# Patient Record
Sex: Female | Born: 1995 | Race: Black or African American | Hispanic: No | Marital: Single | Smoking: Former smoker
Health system: Southern US, Community
[De-identification: ages and names within clinical notes are randomized; demographics above are authoritative.]

---

## 2018-05-05 ENCOUNTER — Encounter (HOSPITAL_COMMUNITY): Payer: Self-pay | Admitting: *Deleted

## 2018-05-05 ENCOUNTER — Emergency Department (HOSPITAL_COMMUNITY)
Admission: EM | Admit: 2018-05-05 | Discharge: 2018-05-05 | Disposition: A | Discharge status: Home / Self Care | Payer: Self-pay | Attending: Emergency Medicine | Admitting: Emergency Medicine

## 2018-05-05 DIAGNOSIS — Z87891 Personal history of nicotine dependence: Secondary | ICD-10-CM | POA: Insufficient documentation

## 2018-05-05 DIAGNOSIS — H02846 Edema of left eye, unspecified eyelid: Secondary | ICD-10-CM

## 2018-05-05 DIAGNOSIS — H02844 Edema of left upper eyelid: Secondary | ICD-10-CM | POA: Insufficient documentation

## 2018-05-05 MED ORDER — FLUORESCEIN SODIUM 1 MG OP STRP
1.0000 | ORAL_STRIP | Freq: Once | OPHTHALMIC | Status: AC
  Administered 2018-05-05: 1 via OPHTHALMIC
  Filled 2018-05-05: qty 1

## 2018-05-05 MED ORDER — TETRACAINE HCL 0.5 % OP SOLN
1.0000 [drp] | Freq: Once | OPHTHALMIC | Status: AC
  Administered 2018-05-05: 1 [drp] via OPHTHALMIC
  Filled 2018-05-05: qty 4

## 2018-05-05 MED ORDER — LORATADINE 10 MG PO TABS: 10.0000 mg | ORAL_TABLET | Freq: Every day | ORAL | 0 refills | Status: AC

## 2018-05-05 MED ORDER — HYPROMELLOSE (GONIOSCOPIC) 2.5 % OP SOLN: 1.0000 [drp] | Freq: Three times a day (TID) | OPHTHALMIC | 12 refills | Status: AC | PRN

## 2018-05-05 NOTE — ED Triage Notes (Signed)
Pt presents to ED with c/o left eye swelling, x 2-3 days, redness and irritation. Reports pain/pressure, no itchiness or drainage. Vision is slightly blurry.

## 2018-05-05 NOTE — ED Provider Notes (Signed)
[facility]-EMERGENCY DEPT Provider Note   CSN: [id number] Arrival date & time: 05/05/18  1907     History   Chief Complaint Chief Complaint  Patient presents with  . Facial Swelling    left eyelid    HPI Caitlin Melendez is a 22 y.o. female who presents to the ED with c/o left eye lid swelling. Symptoms started x 2-3 days, redness and irritation.  No itchiness or drainage. Vision is slightly blurry. Patient wears contact lenses. She denies swimming or hot tubs while wearing her contacts.   HPI  History reviewed. No pertinent past medical history.  There are no active problems to display for this patient.   History reviewed. No pertinent surgical history.   OB History   None      Home Medications    Prior to Admission medications   Medication Sig Start Date End Date Taking? Authorizing Provider  hydroxypropyl methylcellulose / hypromellose (ISOPTO TEARS / GONIOVISC) 2.5 % ophthalmic solution Place 1 drop into the left eye 3 (three) times daily as needed for dry eyes. 05/05/18   Janne Napoleon, NP  loratadine (CLARITIN) 10 MG tablet Take 1 tablet (10 mg total) by mouth daily. 05/05/18   Janne Napoleon, NP    Family History No family history on file.  Social History Social History   Tobacco Use  . Smoking status: Former Games developer  . Smokeless tobacco: Never Used  Substance Use Topics  . Alcohol use: Yes  . Drug use: Never     Allergies   Patient has no known allergies.   Review of Systems Review of Systems  Eyes: Positive for photophobia, pain, redness and visual disturbance. Negative for discharge and itching.  All other systems reviewed and are negative.    Physical Exam Updated Vital Signs BP 134/88 (BP Location: Left Arm)   Pulse 93   Temp 99.2 F (37.3 C) (Oral)   Resp 18   SpO2 100%   Physical Exam  Constitutional: She appears well-developed and well-nourished. No distress.  HENT:  Head: Normocephalic.  Eyes: Pupils are  equal, round, and reactive to light. EOM are normal. Lids are everted and swept, no foreign bodies found. Left eye exhibits no discharge and no hordeolum. No foreign body present in the left eye. Left conjunctiva is injected.  Fundoscopic exam:      The left eye shows no exudate.  Slit lamp exam:      The left eye shows no corneal abrasion, no corneal ulcer, no foreign body and no fluorescein uptake.  Left upper eyelid with mild swelling. Eye pressure 24 each eye.  Neck: Neck supple.  Cardiovascular: Normal rate.  Pulmonary/Chest: Effort normal.  Abdominal: Soft. There is no tenderness.  Musculoskeletal: Normal range of motion.  Neurological: She is alert.  Skin: Skin is warm and dry.  Psychiatric: She has a normal mood and affect.  Nursing note and vitals reviewed.    ED Treatments / Results  Labs (all labs ordered are listed, but only abnormal results are displayed) Labs Reviewed - No data to display  Radiology No results found.  Procedures Procedures (including critical care time)  Medications Ordered in ED Medications  tetracaine (PONTOCAINE) 0.5 % ophthalmic solution 1 drop (1 drop Left Eye Given 05/05/18 2110)  fluorescein ophthalmic strip 1 strip (1 strip Left Eye Given 05/05/18 2110)     Initial Impression / Assessment and Plan / ED Course  I have reviewed the triage vital signs and the nursing  notes. 22 y.o. female here with left eye lid swelling and pain.  Final Clinical Impressions(s) / ED Diagnoses   Final diagnoses:  Swelling of left eyelid    ED Discharge Orders         Ordered    loratadine (CLARITIN) 10 MG tablet  Daily     05/05/18 2224    hydroxypropyl methylcellulose / hypromellose (ISOPTO TEARS / GONIOVISC) 2.5 % ophthalmic solution  3 times daily PRN     05/05/18 2224           Kerrie Buffalo Lyman, NP 05/05/18 2228    Derwood Kaplan, MD 05/06/18 [phone]

## 2018-05-05 NOTE — Discharge Instructions (Signed)
You may take Benadryl at night for swelling of the eye lid. Use the drops if your eye feels dry. Apply cool wet compresses for comfort and to help with swelling. Call the eye doctor on Monday and tell them you were seen in the ED and need a follow up appointment this week.

## 2019-02-20 ENCOUNTER — Other Ambulatory Visit: Payer: Self-pay

## 2019-02-20 DIAGNOSIS — Z20822 Contact with and (suspected) exposure to covid-19: Secondary | ICD-10-CM

## 2019-02-24 LAB — NOVEL CORONAVIRUS, NAA: SARS-CoV-2, NAA: NOT DETECTED

## 2019-03-20 ENCOUNTER — Other Ambulatory Visit: Payer: Self-pay

## 2019-03-20 DIAGNOSIS — Z20822 Contact with and (suspected) exposure to covid-19: Secondary | ICD-10-CM

## 2019-03-22 LAB — NOVEL CORONAVIRUS, NAA: SARS-CoV-2, NAA: NOT DETECTED

## 2019-10-17 ENCOUNTER — Ambulatory Visit: Payer: Self-pay

## 2020-09-01 ENCOUNTER — Ambulatory Visit: Payer: Self-pay

## 2021-09-09 ENCOUNTER — Encounter (HOSPITAL_COMMUNITY): Payer: Self-pay | Admitting: Emergency Medicine

## 2021-09-09 ENCOUNTER — Emergency Department (HOSPITAL_COMMUNITY)
Admission: EM | Admit: 2021-09-09 | Discharge: 2021-09-10 | Disposition: A | Discharge status: Home / Self Care | Payer: BC Managed Care – PPO | Attending: Emergency Medicine | Admitting: Emergency Medicine

## 2021-09-09 ENCOUNTER — Other Ambulatory Visit: Payer: Self-pay

## 2021-09-09 DIAGNOSIS — U071 COVID-19: Secondary | ICD-10-CM | POA: Insufficient documentation

## 2021-09-09 DIAGNOSIS — R059 Cough, unspecified: Secondary | ICD-10-CM | POA: Diagnosis present

## 2021-09-09 NOTE — ED Triage Notes (Signed)
Patient reports persistent dry cough with SOB and rhinorrhea/nasal congestion  for several days .

## 2021-09-10 ENCOUNTER — Emergency Department (HOSPITAL_COMMUNITY): Payer: BC Managed Care – PPO

## 2021-09-10 LAB — RESP PANEL BY RT-PCR (FLU A&B, COVID) ARPGX2
Influenza A by PCR: NEGATIVE
Influenza B by PCR: NEGATIVE
SARS Coronavirus 2 by RT PCR: POSITIVE — AB

## 2021-09-10 MED ORDER — BENZONATATE 100 MG PO CAPS: 100.0000 mg | ORAL_CAPSULE | Freq: Three times a day (TID) | ORAL | 0 refills | Status: AC

## 2021-09-10 MED ORDER — ALBUTEROL SULFATE HFA 108 (90 BASE) MCG/ACT IN AERS
2.0000 | INHALATION_SPRAY | Freq: Once | RESPIRATORY_TRACT | Status: AC
  Administered 2021-09-10: 2 via RESPIRATORY_TRACT
  Filled 2021-09-10: qty 6.7

## 2021-09-10 NOTE — ED Notes (Signed)
Patient moved to appropriate seating area 

## 2021-09-10 NOTE — Discharge Instructions (Addendum)
Take 2 puffs of the albuterol inhaler as directed   Take tessalon as directed   Stay well hydrated and rotate tylenol and motrin for fevers or body aches  Please follow up with your primary care provider within 5-7 days for re-evaluation of your symptoms. If you do not have a primary care provider, information for a healthcare clinic has been provided for you to make arrangements for follow up care. Please return to the emergency department for any new or worsening symptoms.

## 2021-09-10 NOTE — ED Provider Notes (Signed)
[facility] EMERGENCY DEPARTMENT Provider Note   CSN: [id number] Arrival date & time: 09/09/21  2344     History  Chief Complaint  Patient presents with   SOB / Cough    Covid+    Caitlin Melendez is a 26 y.o. female.  HPI  26 year old female presents to the emergency department today for evaluation of rhinorrhea, nasal congestion, productive cough with clear sputum that has been present for the last several days.  She states that today she started having some shortness of breath.  She denies any chest discomfort or persistent fevers.  Does not report any vomiting or diarrhea.  Home Medications Prior to Admission medications   Medication Sig Start Date End Date Taking? Authorizing Provider  benzonatate (TESSALON) 100 MG capsule Take 1 capsule (100 mg total) by mouth every 8 (eight) hours for 5 days. 09/10/21 09/15/21 Yes Kenlea Woodell S, PA-C  hydroxypropyl methylcellulose / hypromellose (ISOPTO TEARS / GONIOVISC) 2.5 % ophthalmic solution Place 1 drop into the left eye 3 (three) times daily as needed for dry eyes. 05/05/18   Janne Napoleon, NP  loratadine (CLARITIN) 10 MG tablet Take 1 tablet (10 mg total) by mouth daily. 05/05/18   Janne Napoleon, NP      Allergies    Patient has no known allergies.    Review of Systems   Review of Systems See HPI for pertinent positives or negatives.   Physical Exam Updated Vital Signs BP 140/86 (BP Location: Left Arm)    Pulse 99    Temp 99.8 F (37.7 C) (Oral)    Resp (!) 30    Ht 5\' 6"  (1.676 m)    Wt 108 kg    SpO2 98%    BMI 38.43 kg/m  Physical Exam Vitals and nursing note reviewed.  Constitutional:      General: She is not in acute distress.    Appearance: She is well-developed.  HENT:     Head: Normocephalic and atraumatic.  Eyes:     Conjunctiva/sclera: Conjunctivae normal.  Cardiovascular:     Rate and Rhythm: Normal rate and regular rhythm.     Heart sounds: Normal heart sounds.  Pulmonary:     Effort:  Pulmonary effort is normal. No respiratory distress.     Breath sounds: Normal breath sounds. No wheezing, rhonchi or rales.  Musculoskeletal:        General: Normal range of motion.     Cervical back: Neck supple.  Skin:    General: Skin is warm and dry.  Neurological:     Mental Status: She is alert.     ED Results / Procedures / Treatments   Labs (all labs ordered are listed, but only abnormal results are displayed) Labs Reviewed  RESP PANEL BY RT-PCR (FLU A&B, COVID) ARPGX2 - Abnormal; Notable for the following components:      Result Value   SARS Coronavirus 2 by RT PCR POSITIVE (*)    All other components within normal limits    EKG None  Radiology DG Chest 2 View  Result Date: 09/10/2021 CLINICAL DATA:  Cough, shortness of breath EXAM: CHEST - 2 VIEW COMPARISON:  None. FINDINGS: Low lung volumes. Cardiac and mediastinal contours are within normal limits. No focal pulmonary opacity. No pleural effusion or pneumothorax. No acute osseous abnormality. IMPRESSION: No acute cardiopulmonary process. Electronically Signed   By: 11/08/2021 M.D.   On: 09/10/2021 00:42    Procedures Procedures    Medications Ordered  in ED Medications  albuterol (VENTOLIN HFA) 108 (90 Base) MCG/ACT inhaler 2 puff (has no administration in time range)    ED Course/ Medical Decision Making/ A&P                           Medical Decision Making Amount and/or Complexity of Data Reviewed Radiology: ordered.   Patient presenting for evaluation for URI sxs.  Reports symptoms ongoing for several days.  Patient nontoxic, well-appearing, no distress.  Vital signs are reassuring.  Chest x-ray negative for pneumonia, pneumothorax.  Tested for Covid in the ED. Results positive. Will give Rx for symptomatic management. She does not appear to meet admission criteria at this time. Advised on f/u and return precautions. Pt voiced understanding of the plan and reasons to return. All questions answered, pt  stable for d/c.   Final Clinical Impression(s) / ED Diagnoses Final diagnoses:  COVID    Rx / DC Orders ED Discharge Orders          Ordered    benzonatate (TESSALON) 100 MG capsule  Every 8 hours        09/10/21 0244              Karrie Meres, PA-C 09/10/21 0248    Zadie Rhine, MD 09/10/21 [phone]

## 2021-09-10 NOTE — ED Provider Triage Note (Signed)
Emergency Medicine Provider Triage Evaluation Note  Tammyjo Hodson , a 26 y.o. female  was evaluated in triage.  Pt complains of cough, rhinorrhea, congestion. Had some sob today  Review of Systems  Positive: Cough, congestion, rhinorrhea, sob Negative: Sore throat  Physical Exam  BP 140/86 (BP Location: Left Arm)    Pulse 99    Temp 99.8 F (37.7 C) (Oral)    Resp (!) 30    Ht 5\' 6"  (1.676 m)    Wt 108 kg    SpO2 98%    BMI 38.43 kg/m  Gen:   Awake, no distress   Resp:  Normal effort  MSK:   Moves extremities without difficulty  Other:  Dry cough, heart with rrr, lungs ctab  Medical Decision Making  Medically screening exam initiated at 12:11 AM.  Appropriate orders placed.  Mikea Sanclemente was informed that the remainder of the evaluation will be completed by another provider, this initial triage assessment does not replace that evaluation, and the importance of remaining in the ED until their evaluation is complete.     Rodney Booze, PA-C 09/10/21 [phone]

## 2023-01-23 IMAGING — CR DG CHEST 2V
2 series · 2 of 2 positions shown · non-contrast
Comparison: None.

CLINICAL DATA: Cough, shortness of breath

EXAM:
CHEST - 2 VIEW

[chest pa]
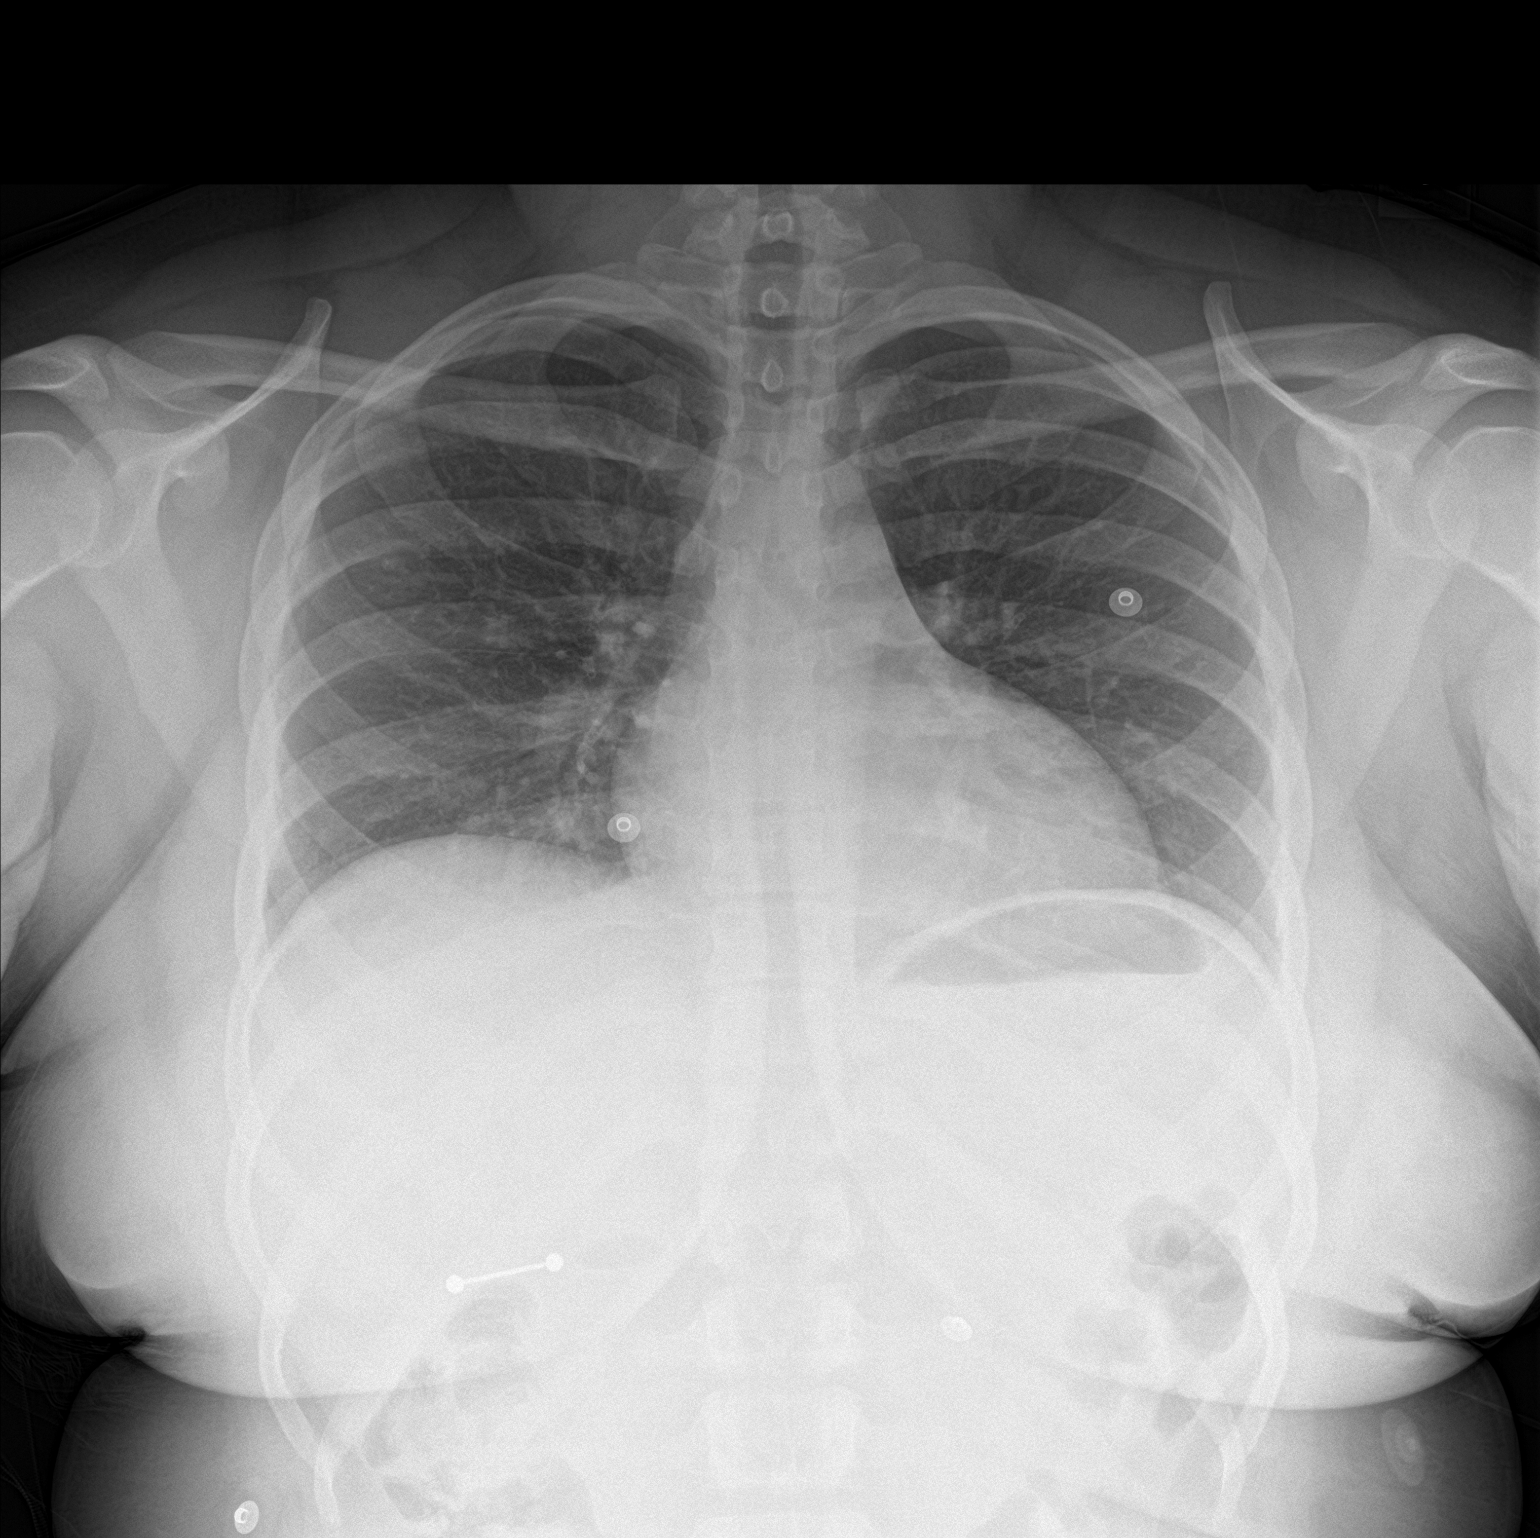

[chest lat]
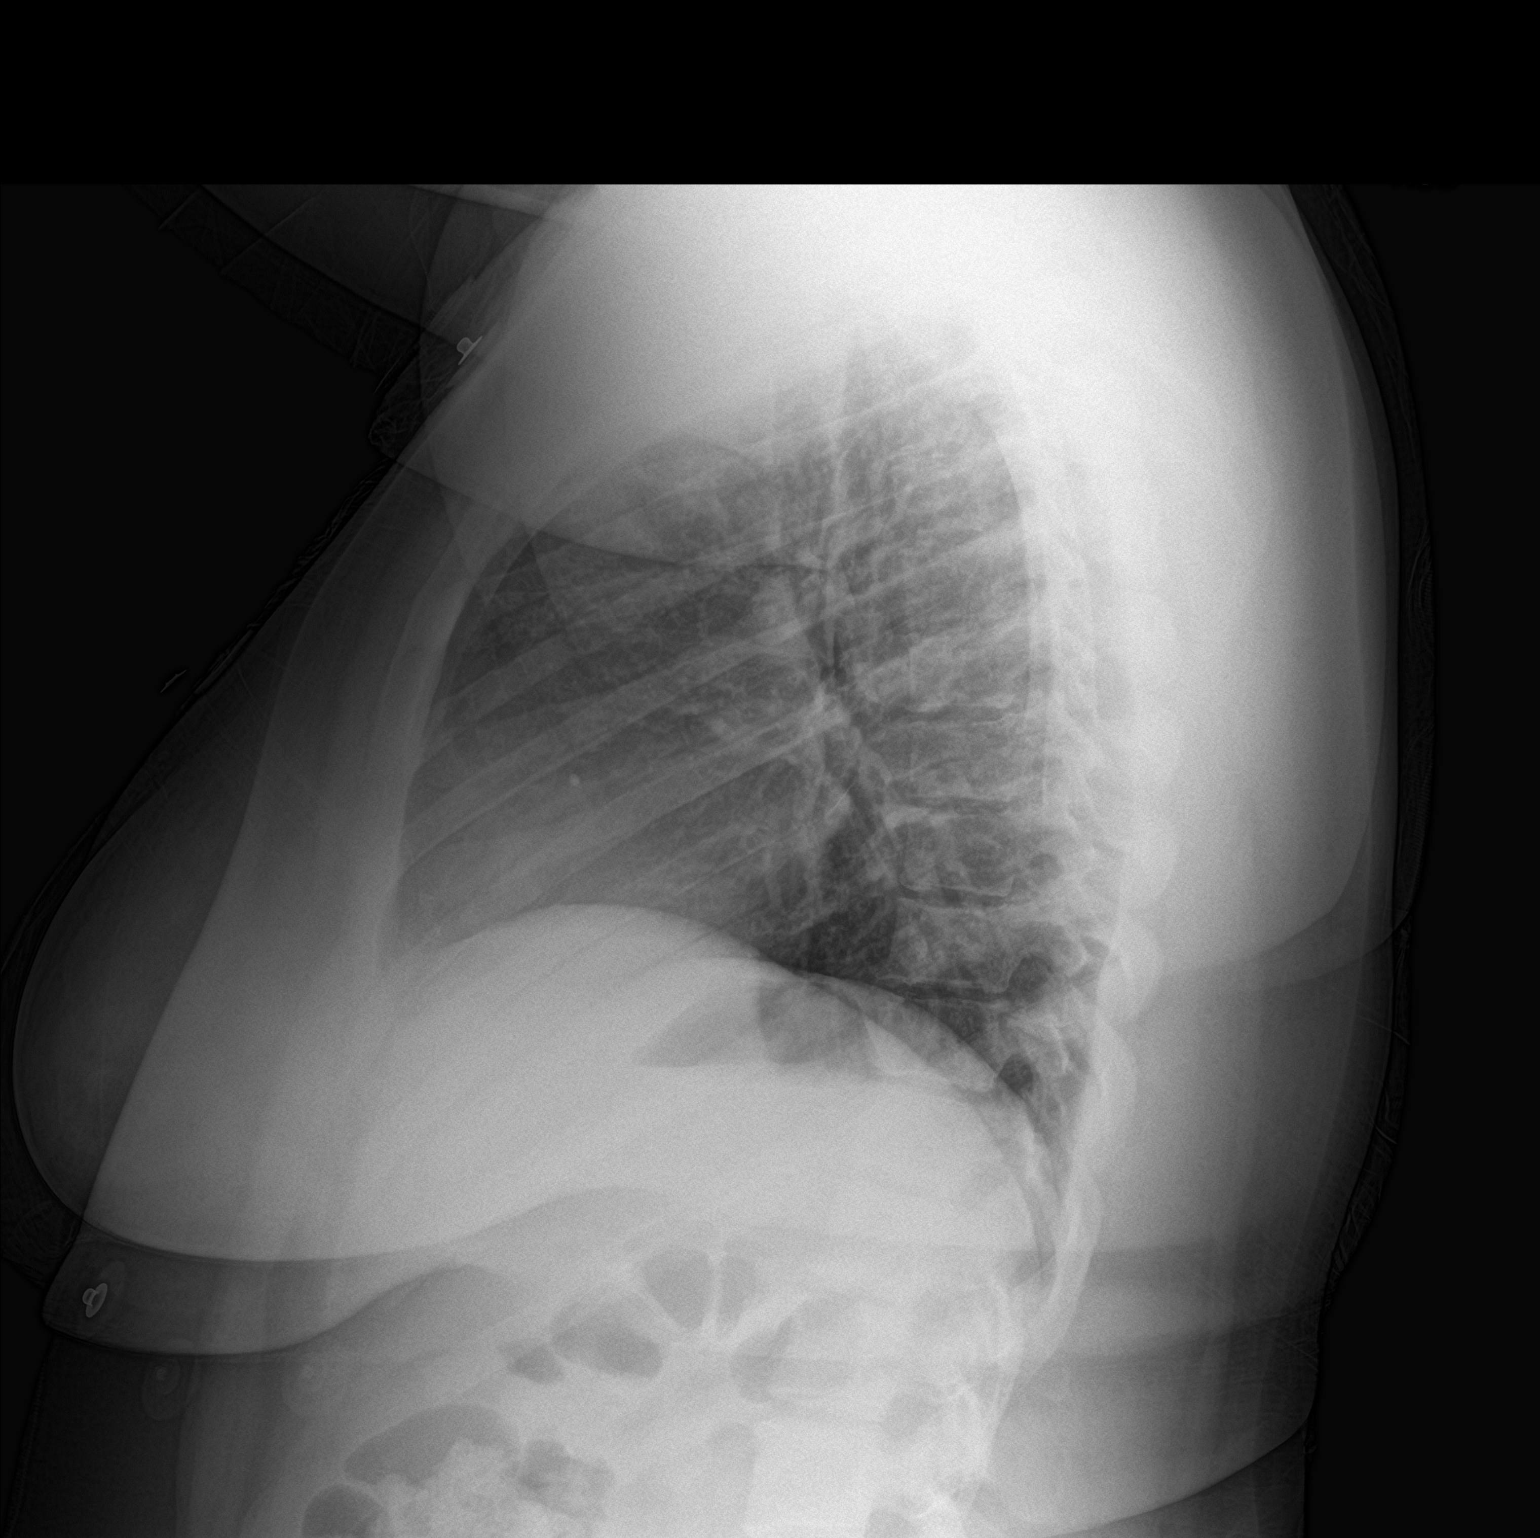

[2 of 2 positions shown; findings below may reference images not displayed]

FINDINGS: Low lung volumes. Cardiac and mediastinal contours are within normal
limits. No focal pulmonary opacity. No pleural effusion or
pneumothorax. No acute osseous abnormality.
IMPRESSION: No acute cardiopulmonary process.
